# Patient Record
Sex: Female | Born: 1960 | Race: White | Hispanic: No
Health system: Southern US, Community
[De-identification: ages and names within clinical notes are randomized; demographics above are authoritative.]

---

## 2014-11-27 ENCOUNTER — Other Ambulatory Visit: Payer: Self-pay | Admitting: Family Medicine

## 2014-11-27 DIAGNOSIS — R1084 Generalized abdominal pain: Secondary | ICD-10-CM

## 2014-11-28 ENCOUNTER — Ambulatory Visit
Admission: RE | Admit: 2014-11-28 | Discharge: 2014-11-28 | Disposition: A | Discharge status: Home / Self Care | Payer: BLUE CROSS/BLUE SHIELD | Source: Ambulatory Visit | Attending: Family Medicine | Admitting: Family Medicine

## 2014-11-28 DIAGNOSIS — R1084 Generalized abdominal pain: Secondary | ICD-10-CM

## 2014-12-17 ENCOUNTER — Other Ambulatory Visit (HOSPITAL_COMMUNITY)
Admission: RE | Admit: 2014-12-17 | Discharge: 2014-12-17 | Disposition: A | Discharge status: Home / Self Care | Payer: BLUE CROSS/BLUE SHIELD | Source: Ambulatory Visit | Attending: Family Medicine | Admitting: Family Medicine

## 2014-12-17 ENCOUNTER — Other Ambulatory Visit: Payer: Self-pay | Admitting: Family Medicine

## 2014-12-17 DIAGNOSIS — Z01419 Encounter for gynecological examination (general) (routine) without abnormal findings: Secondary | ICD-10-CM | POA: Diagnosis present

## 2014-12-17 DIAGNOSIS — Z1151 Encounter for screening for human papillomavirus (HPV): Secondary | ICD-10-CM | POA: Insufficient documentation

## 2014-12-18 LAB — CYTOLOGY - PAP

## 2014-12-25 ENCOUNTER — Other Ambulatory Visit: Payer: Self-pay | Admitting: Physician Assistant

## 2014-12-25 DIAGNOSIS — R160 Hepatomegaly, not elsewhere classified: Secondary | ICD-10-CM

## 2014-12-30 ENCOUNTER — Other Ambulatory Visit (HOSPITAL_COMMUNITY): Payer: Self-pay | Admitting: Physician Assistant

## 2014-12-30 ENCOUNTER — Ambulatory Visit
Admission: RE | Admit: 2014-12-30 | Discharge: 2014-12-30 | Disposition: A | Discharge status: Home / Self Care | Payer: BLUE CROSS/BLUE SHIELD | Source: Ambulatory Visit | Attending: Physician Assistant | Admitting: Physician Assistant

## 2014-12-30 DIAGNOSIS — R160 Hepatomegaly, not elsewhere classified: Secondary | ICD-10-CM

## 2014-12-30 DIAGNOSIS — R188 Other ascites: Principal | ICD-10-CM

## 2014-12-30 DIAGNOSIS — K7460 Unspecified cirrhosis of liver: Secondary | ICD-10-CM

## 2014-12-30 MED ORDER — IOPAMIDOL (ISOVUE-300) INJECTION 61%
100.0000 mL | Freq: Once | INTRAVENOUS | Status: AC | PRN
  Administered 2014-12-30: 100 mL via INTRAVENOUS

## 2015-01-02 ENCOUNTER — Other Ambulatory Visit: Payer: Self-pay | Admitting: Physician Assistant

## 2015-01-02 DIAGNOSIS — K7460 Unspecified cirrhosis of liver: Secondary | ICD-10-CM

## 2015-01-02 DIAGNOSIS — R935 Abnormal findings on diagnostic imaging of other abdominal regions, including retroperitoneum: Secondary | ICD-10-CM

## 2015-01-03 ENCOUNTER — Encounter (HOSPITAL_COMMUNITY): Admission: RE | Admit: 2015-01-03 | Payer: BLUE CROSS/BLUE SHIELD | Source: Ambulatory Visit

## 2015-01-03 ENCOUNTER — Ambulatory Visit (HOSPITAL_COMMUNITY)
Admission: RE | Admit: 2015-01-03 | Discharge: 2015-01-03 | Disposition: A | Discharge status: Home / Self Care | Payer: BLUE CROSS/BLUE SHIELD | Source: Ambulatory Visit | Attending: Physician Assistant | Admitting: Physician Assistant

## 2015-01-03 DIAGNOSIS — K7460 Unspecified cirrhosis of liver: Secondary | ICD-10-CM | POA: Insufficient documentation

## 2015-01-03 DIAGNOSIS — R188 Other ascites: Secondary | ICD-10-CM | POA: Diagnosis present

## 2015-01-03 LAB — BODY FLUID CELL COUNT WITH DIFFERENTIAL
Eos, Fluid: 1 %
Lymphs, Fluid: 20 %
Monocyte-Macrophage-Serous Fluid: 70 % (ref 50–90)
Neutrophil Count, Fluid: 9 % (ref 0–25)
WBC FLUID: 405 uL (ref 0–1000)

## 2015-01-03 LAB — GRAM STAIN

## 2015-01-03 LAB — ALBUMIN, FLUID (OTHER): Albumin, Fluid: 2.3 g/dL

## 2015-01-03 NOTE — Procedures (Signed)
US guided diagnostic/therapeutic paracentesis performed yielding 850 cc slightly hazy, yellow fluid. The fluid was submitted to the lab for preordered studies. No immediate complications.

## 2015-01-08 LAB — CULTURE, BODY FLUID W GRAM STAIN -BOTTLE: Culture: NO GROWTH

## 2015-01-08 LAB — CULTURE, BODY FLUID-BOTTLE

## 2015-01-09 ENCOUNTER — Ambulatory Visit
Admission: RE | Admit: 2015-01-09 | Discharge: 2015-01-09 | Disposition: A | Discharge status: Home / Self Care | Payer: BLUE CROSS/BLUE SHIELD | Source: Ambulatory Visit | Attending: Physician Assistant | Admitting: Physician Assistant

## 2015-01-09 DIAGNOSIS — K7460 Unspecified cirrhosis of liver: Secondary | ICD-10-CM

## 2015-01-09 DIAGNOSIS — R935 Abnormal findings on diagnostic imaging of other abdominal regions, including retroperitoneum: Secondary | ICD-10-CM

## 2015-01-09 MED ORDER — GADOXETATE DISODIUM 0.25 MMOL/ML IV SOLN
10.0000 mL | Freq: Once | INTRAVENOUS | Status: AC | PRN
  Administered 2015-01-09: 10 mL via INTRAVENOUS

## 2015-03-04 ENCOUNTER — Other Ambulatory Visit: Payer: Self-pay | Admitting: Gastroenterology

## 2016-01-23 ENCOUNTER — Other Ambulatory Visit (HOSPITAL_COMMUNITY): Payer: Self-pay | Admitting: Physician Assistant

## 2016-01-23 DIAGNOSIS — K7030 Alcoholic cirrhosis of liver without ascites: Secondary | ICD-10-CM

## 2016-03-03 ENCOUNTER — Ambulatory Visit (HOSPITAL_COMMUNITY)
Admission: RE | Admit: 2016-03-03 | Discharge: 2016-03-03 | Disposition: A | Discharge status: Home / Self Care | Payer: BLUE CROSS/BLUE SHIELD | Source: Ambulatory Visit | Attending: Physician Assistant | Admitting: Physician Assistant

## 2016-03-03 DIAGNOSIS — R932 Abnormal findings on diagnostic imaging of liver and biliary tract: Secondary | ICD-10-CM | POA: Diagnosis not present

## 2016-03-03 DIAGNOSIS — K7030 Alcoholic cirrhosis of liver without ascites: Secondary | ICD-10-CM | POA: Insufficient documentation

## 2016-08-31 IMAGING — CT CT ABD-PEL WO/W CM
3 of 9 series · 12 of 36 positions shown, 18 images · IV contrast (READICAT/WATER & [ID] ISOVUE 300)
Comparison: Ultrasound 11/28/2014

CLINICAL DATA: Followup abnormal abdominal ultrasound examination.
Hepatomegaly. Question of cirrhosis.

EXAM:
CT ABDOMEN AND PELVIS WITHOUT AND WITH CONTRAST
TECHNIQUE: Multidetector CT imaging of the abdomen and pelvis was performed
following the standard protocol before and following the bolus
administration of intravenous contrast.
CONTRAST:  100mL [id number] IOPAMIDOL ([id number]) INJECTION 61%

[Series 5: arterial (id) · axial · arterial · 0.70mm/px · z∈[-253,-73]mm · 4 of 121 slices shown]
[im 25/121  soft-tissue]
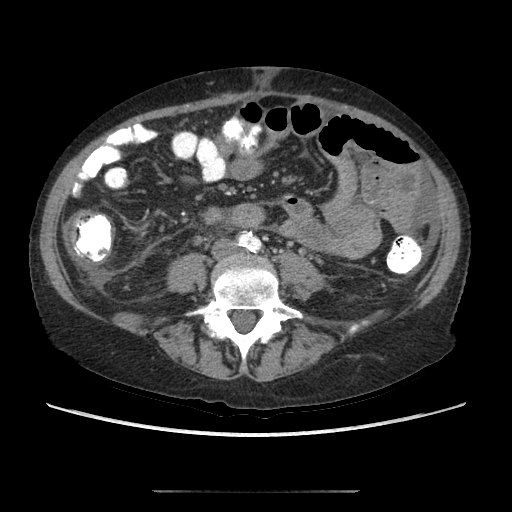
[im 49/121  soft-tissue]
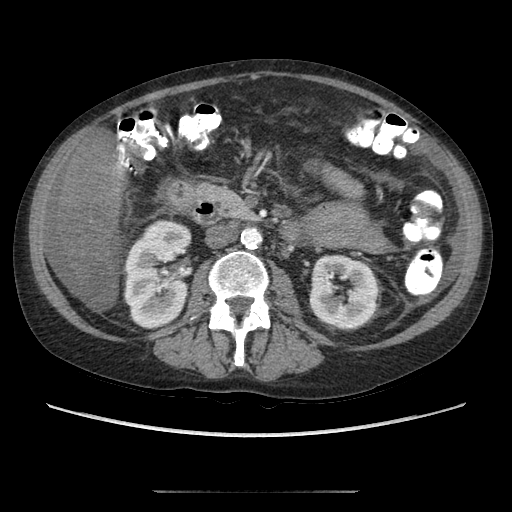
[im 73/121  soft-tissue]
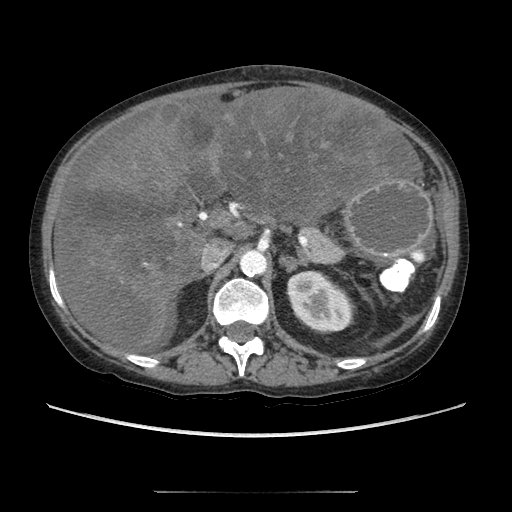
[im 97/121  soft-tissue]
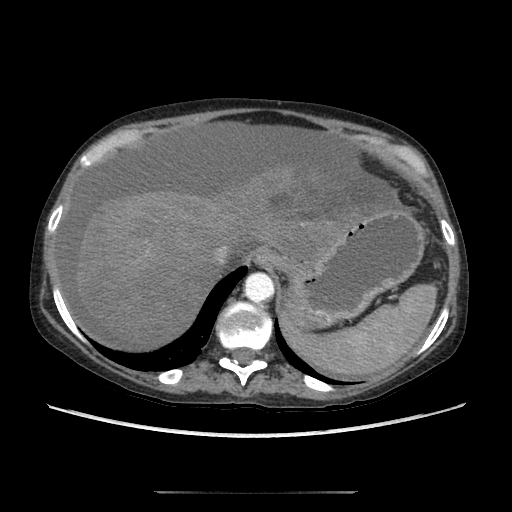

[Series 8: portal venous (id) · axial · portal-venous · 0.70mm/px · z∈[-396,-78]mm · 6 of 179 slices shown, 11 images]
[im 26/179  soft-tissue]
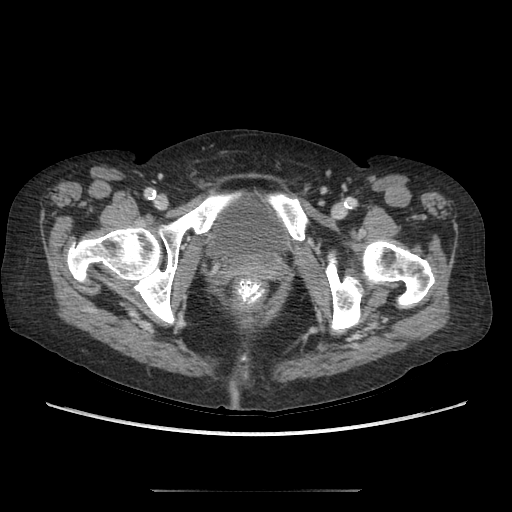
[im 26/179  bone]
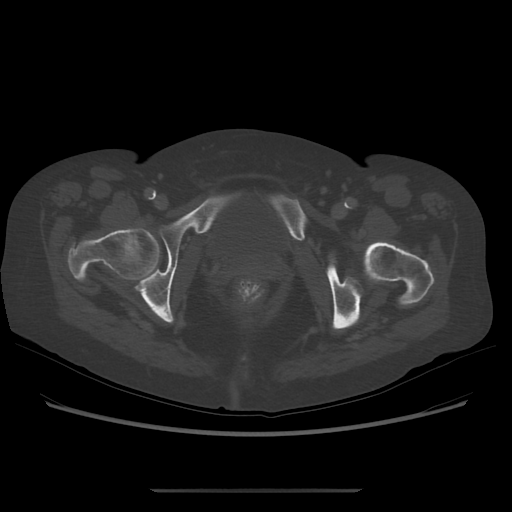
[im 51/179  soft-tissue]
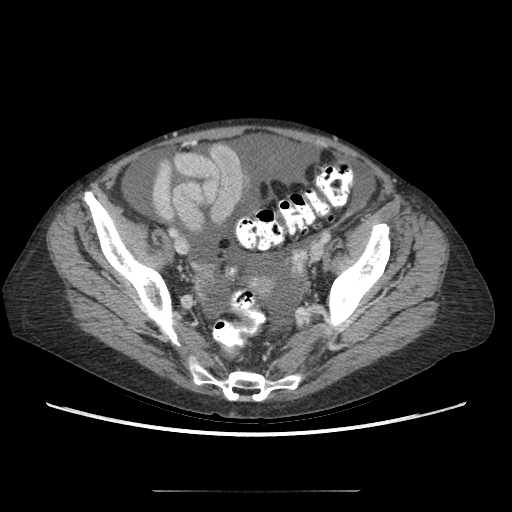
[im 77/179  soft-tissue]
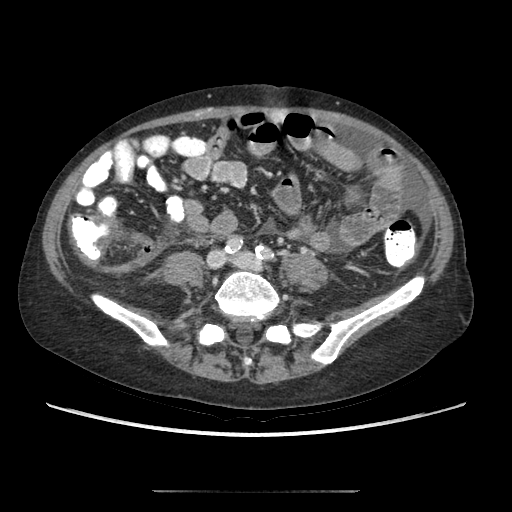
[im 77/179  lung]
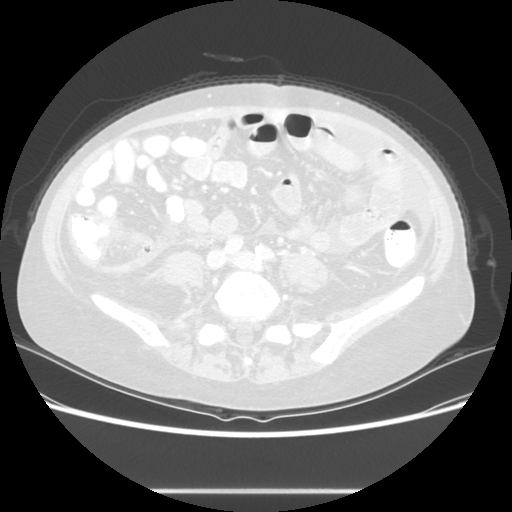
[im 102/179  soft-tissue]
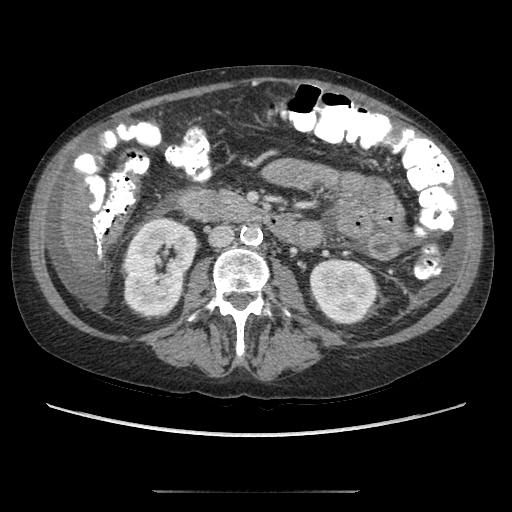
[im 102/179  lung]
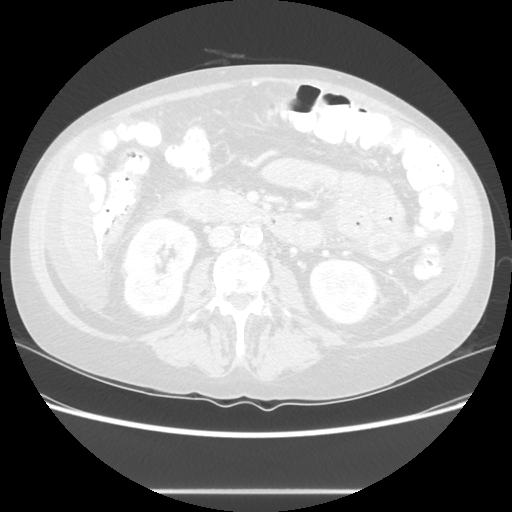
[im 128/179  soft-tissue]
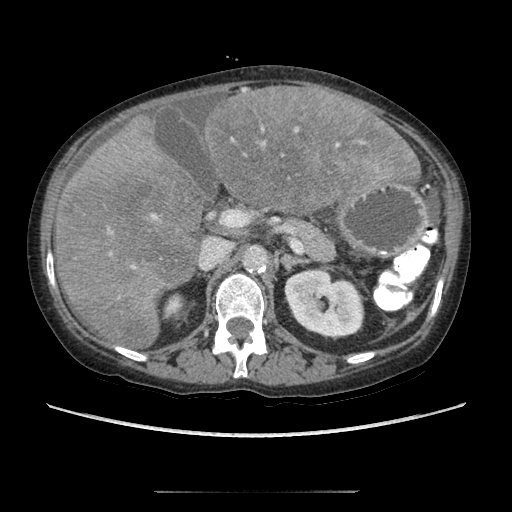
[im 128/179  lung]
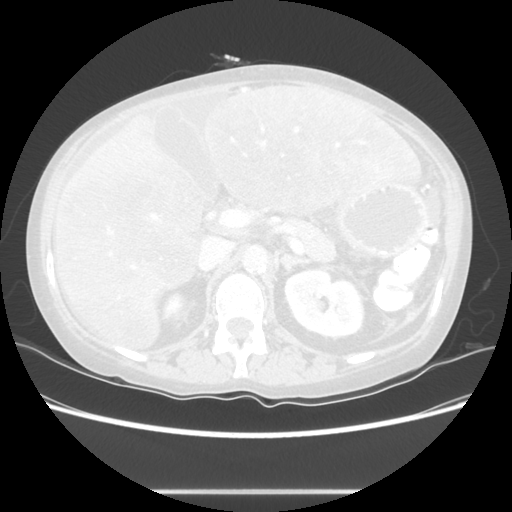
[im 153/179  soft-tissue]
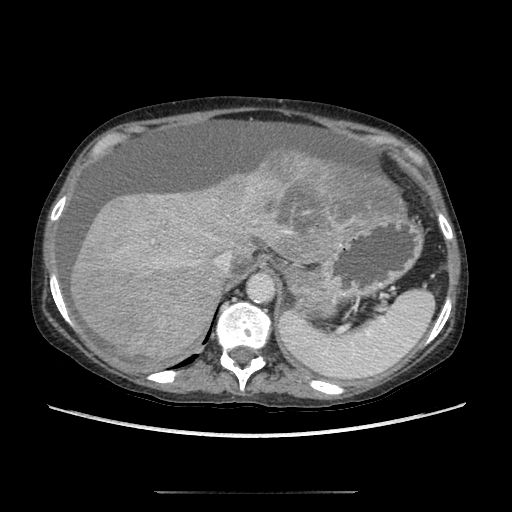
[im 153/179  lung]
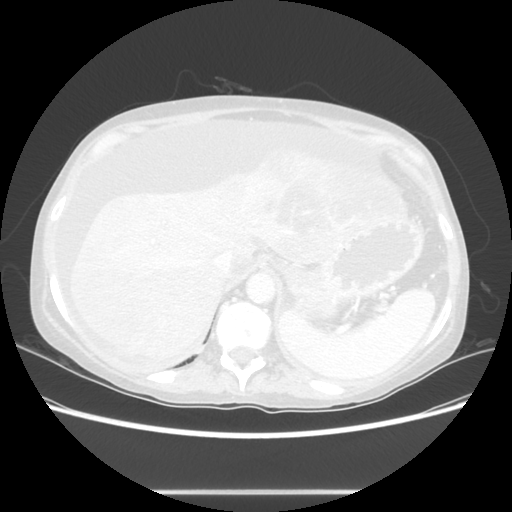

[Series 604: coronal body · coronal · 0.87mm/px · 2 of 104 slices shown, 3 images]
[im 2/104  soft-tissue]
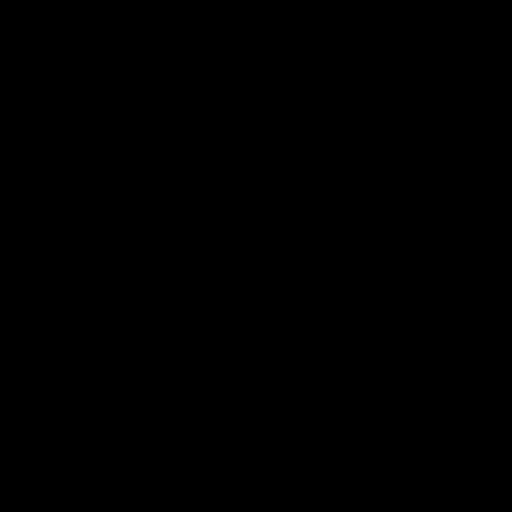
[im 2/104  bone]
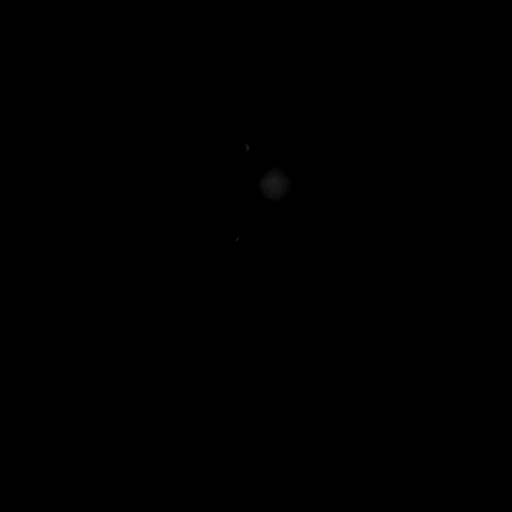
[im 53/104  soft-tissue]
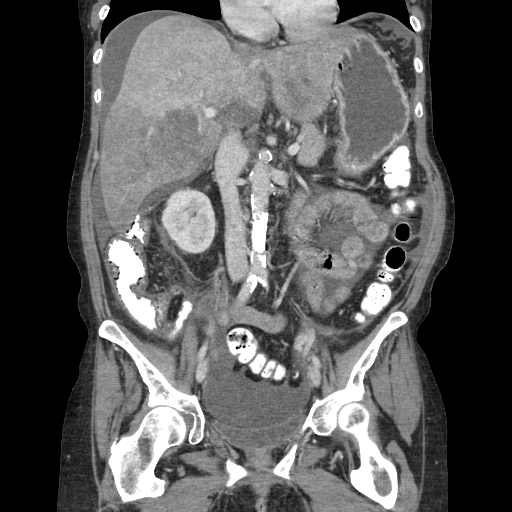

[12 of 36 positions shown; findings below may reference images not displayed]

FINDINGS: Lower chest: The lung bases demonstrate streaky subsegmental
atelectasis. No pleural effusion or pericardial effusion. The distal
esophagus is grossly normal.

Hepatobiliary: The liver is enlarged. This mainly involves the left
hepatic lobe. There are cirrhotic changes along with portal venous
hypertension and portal venous collaterals. No esophageal varices.
There also associated upper abdominal lymph nodes. The liver
demonstrates a very pronounced pattern of geographic fatty
infiltration. I do not see any obvious early arterial phase
enhancing lesions to suggest hepatoma. The portal and hepatic veins
are patent. The gallbladder appears normal. No common bile duct
dilatation. There is a small duodenum diverticulum near the
pancreatic head.

Pancreas: No pancreatic mass, inflammation and or ductal dilatation.

Spleen: Normal size.  No focal lesions.

Adrenals/Urinary Tract: The adrenal glands and kidneys are normal.

Stomach/Bowel: The stomach, duodenum, small bowel and colon are
unremarkable. There is mild wall thickening involving the ascending
colon which is probably due to surrounding fluid. No obstructive
findings or inflammation.

Vascular/Lymphatic: Advanced atherosclerotic calcifications
involving the aorta but no focal aneurysm or dissection. There are
borderline enlarged upper abdominal lymph nodes not unexpected with
cirrhosis and ascites.

Other: No abdominal wall hernia or subcutaneous lesions. Moderate
abdominal/ pelvic ascites. The uterus and ovaries are normal. Small
calcified fibroid is noted. No pelvic mass or adenopathy. No
inguinal mass or adenopathy.

Musculoskeletal: No significant bony findings. Moderate facet
disease involving the lumbar spine.
IMPRESSION: 1. Cirrhotic changes involving the liver with portal venous
hypertension and portal venous collaterals. Associated moderate
volume abdominal/pelvic ascites.
2. Striking pattern of geographic fatty infiltration throughout the
liver without definite hepatic lesions or intrahepatic biliary
dilatation. I would recommend MRI abdomen without and with contrast
for further evaluation and confirmation and to exclude any subtle
liver lesions.
3. Advanced atherosclerotic calcifications involving the aorta but
no aneurysm or dissection.
4. Borderline upper abdominal lymph nodes not atypical with
cirrhosis.

## 2016-09-04 IMAGING — US US PARACENTESIS
1 series · 5 of 5 positions shown · non-contrast
Comparison: None.

MEDICATIONS:
None.

COMPLICATIONS:
None immediate

INDICATION: Cirrhosis by imaging, ascites. Request is made for diagnostic and
therapeutic paracentesis.

EXAM:
ULTRASOUND-GUIDED DIAGNOSTIC AND THERAPEUTIC PARACENTESIS
TECHNIQUE: Informed written consent was obtained from the patient after a
discussion of the risks, benefits and alternatives to treatment. A
timeout was performed prior to the initiation of the procedure.

[Series 1: us paracentesis · 0.25mm/px · 5 of 5 slices shown]
[im 1/5]
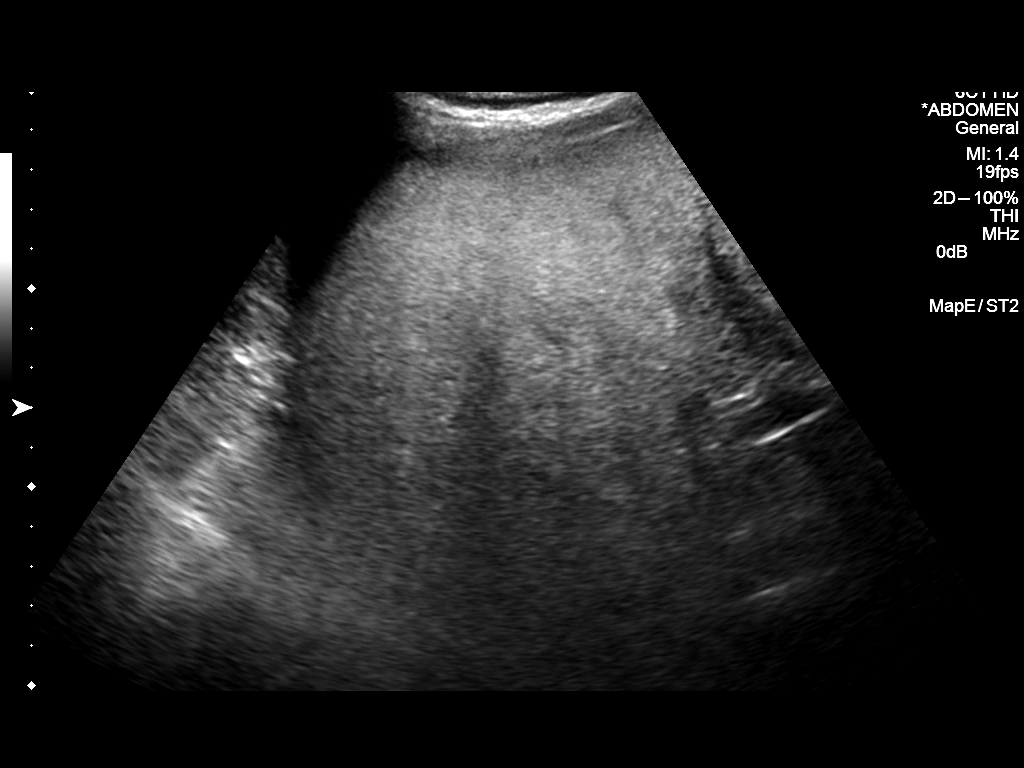
[im 2/5]
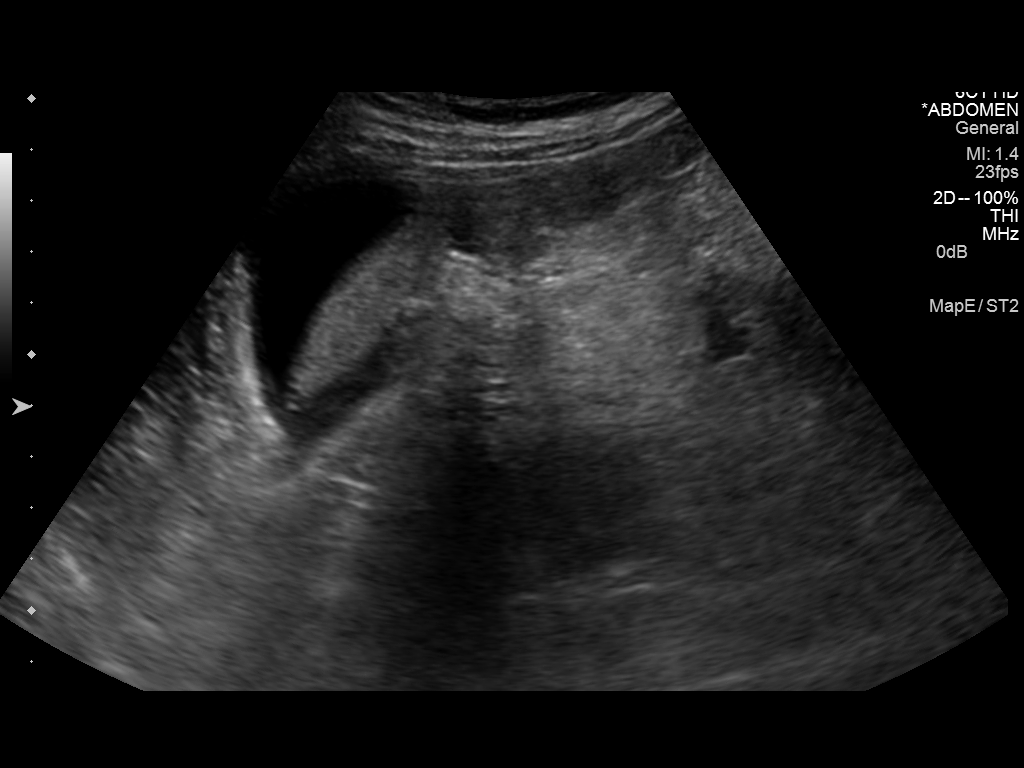
[im 3/5]
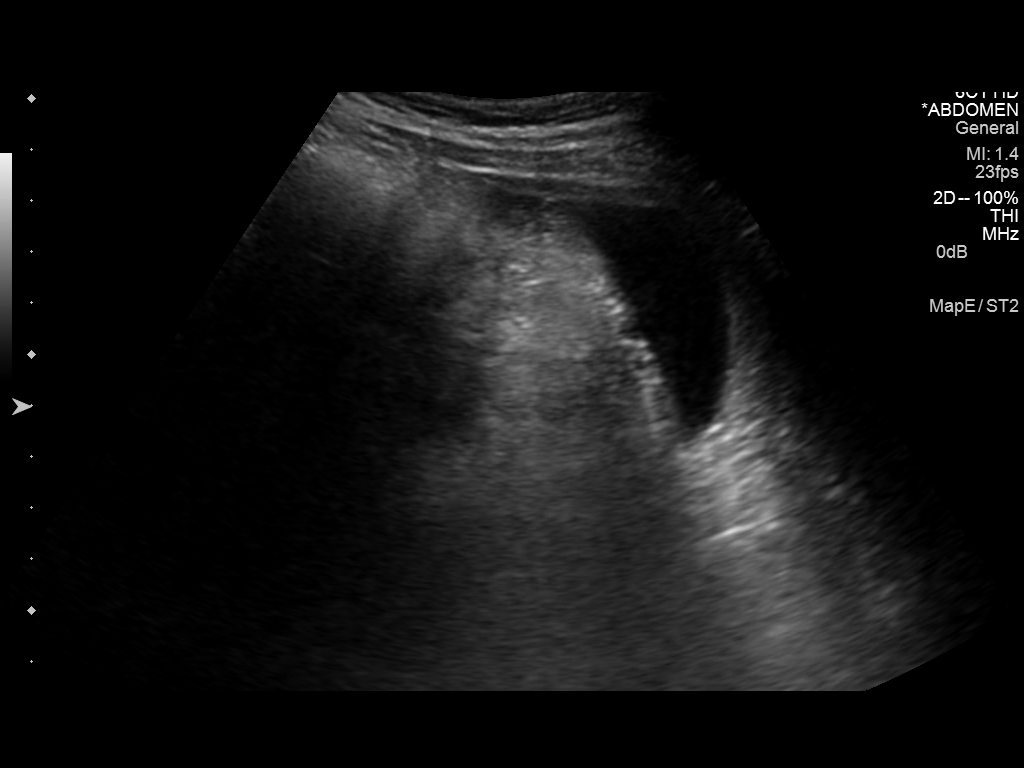
[im 4/5]
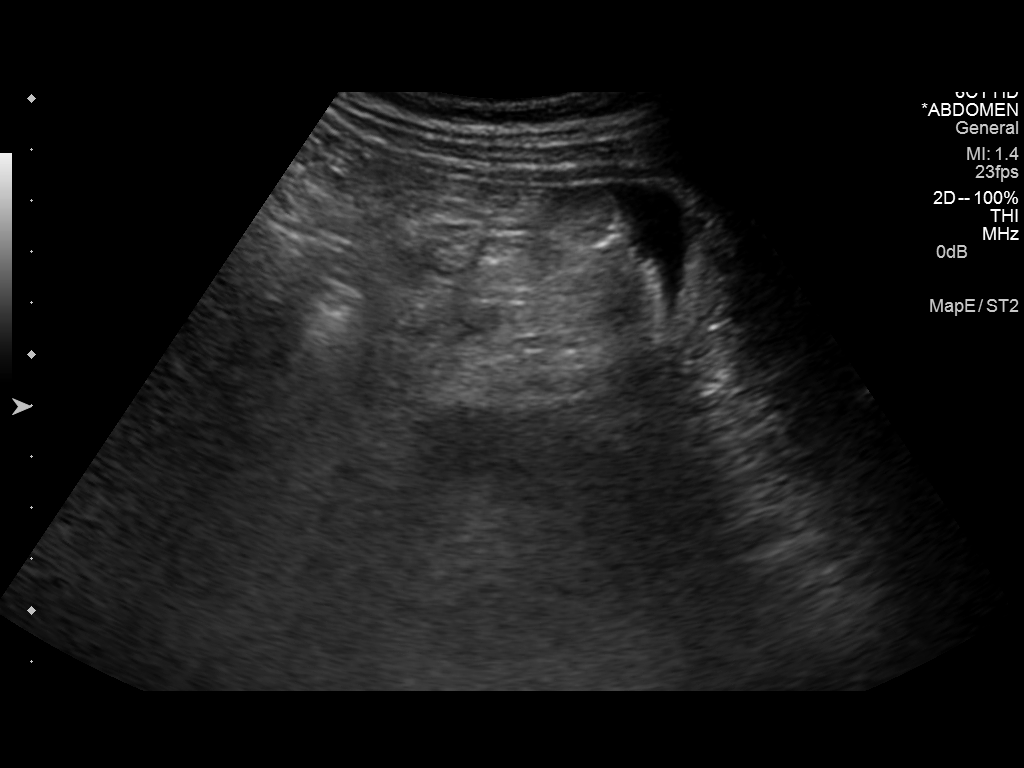
[im 5/5]
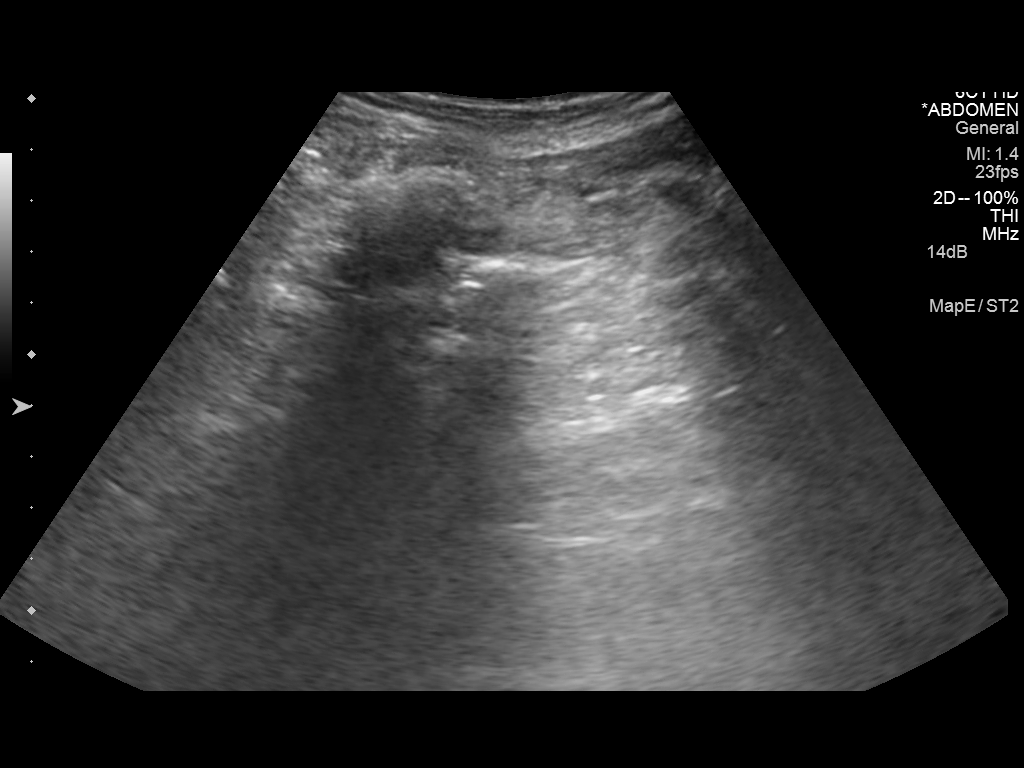

[5 of 5 positions shown; findings below may reference images not displayed]

Initial ultrasound scanning demonstrates a small to moderate amount
of ascites within the left mid to lower lower abdominal quadrant.
The left mid to lower abdomen was prepped and draped in the usual
sterile fashion. 1% lidocaine was used for local anesthesia. Under
direct ultrasound guidance, a 19 gauge, 7-cm, Yueh catheter was
introduced. An ultrasound image was saved for documentation
purposed. The paracentesis was performed. The catheter was removed
and a dressing was applied. The patient tolerated the procedure well
without immediate post procedural complication.
FINDINGS: A total of approximately 850 cc of slightly hazy, yellow fluid was
removed. Samples were sent to the laboratory as requested by the
clinical team.
IMPRESSION: Successful ultrasound-guided diagnostic and therapeutic paracentesis
yielding 850 cc of peritoneal fluid.

## 2017-01-21 ENCOUNTER — Other Ambulatory Visit: Payer: Self-pay | Admitting: Physician Assistant

## 2017-01-21 DIAGNOSIS — K7030 Alcoholic cirrhosis of liver without ascites: Secondary | ICD-10-CM

## 2017-01-27 ENCOUNTER — Other Ambulatory Visit: Payer: BLUE CROSS/BLUE SHIELD

## 2017-02-07 ENCOUNTER — Other Ambulatory Visit: Payer: BLUE CROSS/BLUE SHIELD

## 2017-02-15 ENCOUNTER — Ambulatory Visit
Admission: RE | Admit: 2017-02-15 | Discharge: 2017-02-15 | Disposition: A | Discharge status: Home / Self Care | Payer: BLUE CROSS/BLUE SHIELD | Source: Ambulatory Visit | Attending: Physician Assistant | Admitting: Physician Assistant

## 2017-02-15 DIAGNOSIS — K7030 Alcoholic cirrhosis of liver without ascites: Secondary | ICD-10-CM

## 2018-01-05 ENCOUNTER — Other Ambulatory Visit: Payer: Self-pay

## 2018-01-05 DIAGNOSIS — Z1231 Encounter for screening mammogram for malignant neoplasm of breast: Secondary | ICD-10-CM

## 2018-02-21 DEATH — deceased
